# Patient Record
Sex: Female | Born: 1965 | Race: White | Hispanic: No | Marital: Married
Health system: Southern US, Community
[De-identification: ages and names within clinical notes are randomized; demographics above are authoritative.]

---

## 2002-05-27 ENCOUNTER — Encounter: Admission: RE | Admit: 2002-05-27 | Discharge: 2002-05-27 | Payer: Self-pay | Admitting: Sports Medicine

## 2002-06-24 ENCOUNTER — Encounter: Admission: RE | Admit: 2002-06-24 | Discharge: 2002-06-24 | Payer: Self-pay | Admitting: Family Medicine

## 2006-02-21 ENCOUNTER — Encounter: Admission: RE | Admit: 2006-02-21 | Discharge: 2006-02-21 | Payer: Self-pay | Admitting: Family Medicine

## 2013-01-15 ENCOUNTER — Other Ambulatory Visit: Payer: Self-pay | Admitting: Emergency Medicine

## 2013-01-15 DIAGNOSIS — E049 Nontoxic goiter, unspecified: Secondary | ICD-10-CM

## 2013-01-17 ENCOUNTER — Other Ambulatory Visit: Payer: Self-pay

## 2013-01-17 ENCOUNTER — Ambulatory Visit
Admission: RE | Admit: 2013-01-17 | Discharge: 2013-01-17 | Disposition: A | Discharge status: Home / Self Care | Payer: BC Managed Care – PPO | Source: Ambulatory Visit | Attending: Emergency Medicine | Admitting: Emergency Medicine

## 2013-01-17 DIAGNOSIS — E049 Nontoxic goiter, unspecified: Secondary | ICD-10-CM

## 2018-06-03 ENCOUNTER — Other Ambulatory Visit: Payer: Self-pay | Admitting: Family Medicine

## 2018-06-03 ENCOUNTER — Ambulatory Visit
Admission: RE | Admit: 2018-06-03 | Discharge: 2018-06-03 | Disposition: A | Discharge status: Home / Self Care | Payer: BLUE CROSS/BLUE SHIELD | Source: Ambulatory Visit | Attending: Family Medicine | Admitting: Family Medicine

## 2018-06-03 DIAGNOSIS — M25579 Pain in unspecified ankle and joints of unspecified foot: Secondary | ICD-10-CM

## 2018-09-25 ENCOUNTER — Ambulatory Visit: Payer: BLUE CROSS/BLUE SHIELD | Admitting: Physical Therapy

## 2018-10-02 ENCOUNTER — Encounter: Payer: BLUE CROSS/BLUE SHIELD | Admitting: Physical Therapy

## 2018-10-02 ENCOUNTER — Ambulatory Visit (INDEPENDENT_AMBULATORY_CARE_PROVIDER_SITE_OTHER): Payer: BLUE CROSS/BLUE SHIELD | Admitting: Physical Therapy

## 2018-10-02 DIAGNOSIS — M25572 Pain in left ankle and joints of left foot: Secondary | ICD-10-CM | POA: Diagnosis not present

## 2018-10-02 DIAGNOSIS — R2689 Other abnormalities of gait and mobility: Secondary | ICD-10-CM | POA: Diagnosis not present

## 2018-10-02 NOTE — Patient Instructions (Signed)
Access Code: [email or web address]  URL: [email or web address]  Date: 10/02/2018  Prepared by: Sedalia Muta   Exercises  Gastroc Stretch on Wall - 3 reps - 30 hold - 2x daily  Standing Dorsiflexion Self-Mobilization on Step - 10 reps - 5 hold - 2x daily  Long Sitting Ankle Dorsiflexion AROM - 10 reps - 1 sets - 2x daily  Seated Heel Raise - 10 reps - 1 sets - 2x daily

## 2018-10-03 ENCOUNTER — Encounter: Payer: Self-pay | Admitting: Physical Therapy

## 2018-10-03 NOTE — Therapy (Addendum)
[facility] [address] [place], [place], [address] Phone: [phone]   Fax:  [phone]  Physical Therapy Evaluation  Patient Details  Name: Erin Wiggins MRN: [id number] Date of Birth: 22-Sep-1965 Referring Provider (PT): Shelly Flatten   Encounter Date: 10/02/2018  PT End of Session - 10/03/18 2247    Visit Number  1    Number of Visits  12    Date for PT Re-Evaluation  11/12/18    Authorization Type  BCBS    PT Start Time  1425    PT Stop Time  1512    PT Time Calculation (min)  47 min    Activity Tolerance  Patient tolerated treatment well    Behavior During Therapy  [facility] for tasks assessed/performed       History reviewed. No pertinent past medical history.  History reviewed. No pertinent surgical history.  There were no vitals filed for this visit.  Subjective Assessment - 10/03/18 2246    Subjective  Pt states ankle sprain around 2005, thinks pain started then. Did wear boot for about 2 month out of boot since end of Jan. Has increased pain at lateral ankle. Increased pain with       Pt works at [employer], does have standing desk.  Denies numbness/tingling, burning.     Currently in Pain?  Yes    Pain Score  5     Pain Location  Ankle    Pain Orientation  Left    Pain Descriptors / Indicators  Aching    Pain Type  Chronic pain    Pain Onset  More than a month ago    Pain Frequency  Intermittent    Aggravating Factors   standing, walking,     Pain Relieving Factors  rest         [facility] PT Assessment - 10/03/18 0001      Assessment   Medical Diagnosis  L peroneal pain    Referring Provider (PT)  Shelly Flatten    Prior Therapy  no      Precautions   Precautions  None      Restrictions   Weight Bearing Restrictions  No      Balance Screen   Has the patient fallen in the past 6 months  No      Prior Function   Level of Independence  Independent      Cognition   Overall Cognitive Status  Within  Functional Limits for tasks assessed      Posture/Postural Control   Posture Comments  Supination bilaterally, rearfoot varus, wide forefoot.       ROM / Strength   AROM / PROM / Strength  AROM;Strength      AROM   Overall AROM Comments  DF mild/moderate limitation bilaterally;  Mild rearfoot EV limitation.       Strength   Overall Strength Comments  Hip: 4/5, Knee: 4/5;  Ankle: 4-/5       Palpation   Palpation comment  Pain at L peroneal tendon,  Significant Pain at base of L fibula(on talus) and on ATFL;  STJ mild/mod hypomobility;       Special Tests   Other special tests  Minimal pain with resisted Inv/Ev.                    [facility] Adult PT Treatment/Exercise - 10/03/18 0001      Exercises   Exercises  Ankle  Ankle Exercises: Stretches   Gastroc Stretch  2 reps;30 seconds    Gastroc Stretch Limitations  at wall    Other Stretch  DF glide on step x20;       Ankle Exercises: Seated   Heel Raises  20 reps    Heel Raises Limitations  Education for mechanics      Ankle Exercises: Supine   Other Supine Ankle Exercises  AROM PF/DF x20;              PT Education - 10/03/18 2246    Education Details  PT POC, HEP    Person(s) Educated  Patient    Methods  Explanation;Demonstration;Tactile cues    Comprehension  Verbalized understanding;Returned demonstration;Need further instruction       PT Short Term Goals - 10/03/18 2253      PT SHORT TERM GOAL #1   Title  Pt to be independent with initial HEP     Time  2    Period  Weeks    Status  New    Target Date  10/16/18      PT SHORT TERM GOAL #2   Title  Pt to report decreased pain in L ankle, to 3/10 with standing and activity.     Time  2    Period  Weeks    Status  New    Target Date  10/16/18        PT Long Term Goals - 10/03/18 2254      PT LONG TERM GOAL #1   Title  Pt to be independent with final HEP     Time  6    Period  Weeks    Status  New    Target Date  11/13/18       PT LONG TERM GOAL #2   Title  Pt to report decreased pain in L ankle, to 0-2/10 with standing and activity.     Time  6    Period  Weeks    Status  New    Target Date  11/13/18      PT LONG TERM GOAL #3   Title  Pt to demo improved DF ROM in L ankle, to be at least 5 deg, to improve gait and stair ability.     Time  6    Period  Weeks    Status  New    Target Date  11/13/18      PT LONG TERM GOAL #4   Title  Pt to demo ability for ambulation, up to .5 mi, with pain no greater than 2/10, to improve ability for community navigation, and ability for exercise     Time  6    Period  Weeks    Status  New    Target Date  11/13/18            Plan - 10/03/18 2248    Clinical Impression Statement  Pt presents with primary complaint of increased pain in Lateral ankle. Pt with pain in peroneal region, as well as at ATFL. Pt with poor foot alignement, with moderate to significant supination and rearfoot varus. Pt with improved foot alignement with current shoes, but pt will benefit from foot wear recommendation for imroved fit and alignment. Pt with decreased ROM in bil ankles, wtih hypomobilie STJ and decreased DF. Pt with decreased strength and significant decrease in stability of L ankle vs R. Pt with poor ability for sustaining ambulation or exercise due to pain.  Pt to benefit from skilled PT to improve deficits and improve pain.     Clinical Presentation  Stable    Clinical Decision Making  Low    Rehab Potential  Good    PT Frequency  2x / week    PT Duration  6 weeks    PT Treatment/Interventions  ADLs/Self Care Home Management;Cryotherapy;Electrical Stimulation;Iontophoresis 4mg /ml Dexamethasone;Functional mobility training;Stair training;Gait training;DME Instruction;Ultrasound;Moist Heat;Therapeutic activities;Therapeutic exercise;Balance training;Neuromuscular re-education;Patient/family education;Passive range of motion;Orthotic Fit/Training;Manual techniques;Dry  needling;Taping;Joint Manipulations    Consulted and Agree with Plan of Care  Patient       Patient will benefit from skilled therapeutic intervention in order to improve the following deficits and impairments:  Abnormal gait, Decreased range of motion, Difficulty walking, Increased muscle spasms, Decreased endurance, Decreased activity tolerance, Pain, Impaired flexibility, Improper body mechanics, Hypomobility, Decreased balance, Decreased strength, Decreased mobility  Visit Diagnosis: Pain in left ankle and joints of left foot  Other abnormalities of gait and mobility     Problem List There are no active problems to display for this patient.  Erin Wiggins, PT, DPT 10:57 PM  10/03/18    [facility] [address] [address] [place], [place], [address] Phone: [phone]   Fax:  [phone]  Name: Erin Wiggins MRN: [id number] Date of Birth: 02/24/1966

## 2018-10-07 ENCOUNTER — Telehealth: Payer: Self-pay | Admitting: General Practice

## 2018-10-07 NOTE — Telephone Encounter (Signed)
°  Assigned the referral to [facility] to [facility] [place] work - Erin Wiggins Their office will contact pt to schedule direclty [place] therapist [place] Address: [address], [place], [place] [address] Phone: [phone]    Copied from CRM [id number]. Topic: General - Other >> Sep 16, 2018  8:58 AM Erin Wiggins wrote: Reason for CRM: pt calling stating she is waiting on a call from [place] to schedule her PT she was told [facility] more new pt >> Sep 16, 2018 11:43 AM Erin Wiggins, Erin Wiggins wrote: Gavin Pound can you help with this?

## 2018-10-09 ENCOUNTER — Ambulatory Visit (INDEPENDENT_AMBULATORY_CARE_PROVIDER_SITE_OTHER): Payer: BLUE CROSS/BLUE SHIELD | Admitting: Physical Therapy

## 2018-10-09 ENCOUNTER — Encounter: Payer: Self-pay | Admitting: Physical Therapy

## 2018-10-09 DIAGNOSIS — R2689 Other abnormalities of gait and mobility: Secondary | ICD-10-CM | POA: Diagnosis not present

## 2018-10-09 DIAGNOSIS — M25572 Pain in left ankle and joints of left foot: Secondary | ICD-10-CM

## 2018-10-09 NOTE — Therapy (Addendum)
[facility] [address] [place], [place], [address] Phone: [phone]   Fax:  [phone]  Physical Therapy Treatment  Patient Details  Name: Erin Wiggins MRN: [id number] Date of Birth: [birth year] Referring Provider (PT): Shelly Flatten   Encounter Date: 10/09/2018  PT End of Session - 10/09/18 1523    Visit Number  2    Number of Visits  12    Date for PT Re-Evaluation  11/12/18    Authorization Type  BCBS    PT Start Time  1434    PT Stop Time  1523    PT Time Calculation (min)  49 min    Activity Tolerance  Patient tolerated treatment well    Behavior During Therapy  [facility] for tasks assessed/performed       History reviewed. No pertinent past medical history.  History reviewed. No pertinent surgical history.  There were no vitals filed for this visit.  Subjective Assessment - 10/09/18 1523    Subjective  Pt states mild decrease in pain.     Currently in Pain?  Yes    Pain Score  3     Pain Location  Ankle    Pain Orientation  Left    Pain Descriptors / Indicators  Aching    Pain Type  Chronic pain    Pain Onset  More than a month ago    Pain Frequency  Intermittent                       [facility] Adult PT Treatment/Exercise - 10/09/18 1433      Posture/Postural Control   Posture Comments  --      Exercises   Exercises  Ankle      Modalities   Modalities  Iontophoresis      Iontophoresis   Type of Iontophoresis  Dexamethasone    Location  L lateral ankle    Time  6 hr patch      Manual Therapy   Manual Therapy  Joint mobilization;Passive ROM    Joint Mobilization  To improve L ankle DF.     Passive ROM  Manual DF stretching      Ankle Exercises: Stretches   Gastroc Stretch  2 reps;30 seconds    Gastroc Stretch Limitations  at wall    Other Stretch  DF glide kneeling at wall x2 min      Ankle Exercises: Aerobic   Stationary Bike  L 1 x 8 min;       Ankle Exercises: Standing   Other  Standing Ankle Exercises  Weight shifts/staggered stance (pre-gait) x20 bil;       Ankle Exercises: Seated   Heel Raises  20 reps    Heel Raises Limitations  with ball at ankles for alignment      Ankle Exercises: Supine   Other Supine Ankle Exercises  --               PT Short Term Goals - 10/03/18 2253      PT SHORT TERM GOAL #1   Title  Pt to be independent with initial HEP     Time  2    Period  Weeks    Status  New    Target Date  10/16/18      PT SHORT TERM GOAL #2   Title  Pt to report decreased pain in L ankle, to 3/10 with standing and activity.     Time  2    Period  Weeks    Status  New    Target Date  10/16/18        PT Long Term Goals - 10/03/18 2254      PT LONG TERM GOAL #1   Title  Pt to be independent with final HEP     Time  6    Period  Weeks    Status  New    Target Date  11/13/18      PT LONG TERM GOAL #2   Title  Pt to report decreased pain in L ankle, to 0-2/10 with standing and activity.     Time  6    Period  Weeks    Status  New    Target Date  11/13/18      PT LONG TERM GOAL #3   Title  Pt to demo improved DF ROM in L ankle, to be at least 5 deg, to improve gait and stair ability.     Time  6    Period  Weeks    Status  New    Target Date  11/13/18      PT LONG TERM GOAL #4   Title  Pt to demo ability for ambulation, up to .5 mi, with pain no greater than 2/10, to improve ability for community navigation, and ability for exercise     Time  6    Period  Weeks    Status  New    Target Date  11/13/18            Plan - 10/09/18 1537    Clinical Impression Statement  Pt with improving pain in lateral anke, is sore with deep palpation. Ther ex for improving Gastroc and DF done today, as well as joint mobs. Ionto done for pain at lateral ankle. Shoe recommendation given for improved alignment of foot and ankle.     Rehab Potential  Good    PT Frequency  2x / week    PT Duration  6 weeks    PT Treatment/Interventions   ADLs/Self Care Home Management;Cryotherapy;Electrical Stimulation;Iontophoresis 4mg /ml Dexamethasone;Functional mobility training;Stair training;Gait training;DME Instruction;Ultrasound;Moist Heat;Therapeutic activities;Therapeutic exercise;Balance training;Neuromuscular re-education;Patient/family education;Passive range of motion;Orthotic Fit/Training;Manual techniques;Dry needling;Taping;Joint Manipulations    Consulted and Agree with Plan of Care  Patient       Patient will benefit from skilled therapeutic intervention in order to improve the following deficits and impairments:  Abnormal gait, Decreased range of motion, Difficulty walking, Increased muscle spasms, Decreased endurance, Decreased activity tolerance, Pain, Impaired flexibility, Improper body mechanics, Hypomobility, Decreased balance, Decreased strength, Decreased mobility  Visit Diagnosis: Pain in left ankle and joints of left foot  Other abnormalities of gait and mobility     Problem List There are no active problems to display for this patient.    Sedalia Muta, PT, DPT 3:41 PM  10/09/18   [place] [address] [place], [place], [address] Phone: [phone]   Fax:  [phone]  Name: Erin Wiggins MRN: [id number] Date of Birth: Feb 11, 1966

## 2018-10-14 ENCOUNTER — Ambulatory Visit (INDEPENDENT_AMBULATORY_CARE_PROVIDER_SITE_OTHER): Payer: BLUE CROSS/BLUE SHIELD | Admitting: Physical Therapy

## 2018-10-14 ENCOUNTER — Encounter: Payer: Self-pay | Admitting: Physical Therapy

## 2018-10-14 DIAGNOSIS — M25572 Pain in left ankle and joints of left foot: Secondary | ICD-10-CM | POA: Diagnosis not present

## 2018-10-14 DIAGNOSIS — R2689 Other abnormalities of gait and mobility: Secondary | ICD-10-CM

## 2018-10-15 NOTE — Therapy (Signed)
[facility] [address] [place], [place], [address] Phone: [phone]   Fax:  [phone]  Physical Therapy Treatment  Patient Details  Name: Erin Wiggins MRN: [id number] Date of Birth: January 22, 1966 Referring Provider (PT): Shelly Flatten   Encounter Date: 10/14/2018  PT End of Session - 10/14/18 1440    Visit Number  3    Number of Visits  12    Date for PT Re-Evaluation  11/12/18    Authorization Type  BCBS    PT Start Time  1435    PT Stop Time  1515    PT Time Calculation (min)  40 min    Activity Tolerance  Patient tolerated treatment well    Behavior During Therapy  [facility] for tasks assessed/performed       History reviewed. No pertinent past medical history.  History reviewed. No pertinent surgical history.  There were no vitals filed for this visit.  Subjective Assessment - 10/14/18 1440    Subjective  Pt states soreness today.     Currently in Pain?  Yes    Pain Score  5     Pain Location  Ankle    Pain Orientation  Left    Pain Descriptors / Indicators  Aching    Pain Type  Chronic pain    Pain Onset  More than a month ago    Pain Frequency  Intermittent                       [facility] Adult PT Treatment/Exercise - 10/14/18 1443      Exercises   Exercises  Ankle      Modalities   Modalities  Iontophoresis      Iontophoresis   Type of Iontophoresis  Dexamethasone    Location  L lateral ankle    Time  6 hr patch      Manual Therapy   Manual Therapy  Joint mobilization;Passive ROM;Soft tissue mobilization    Joint Mobilization  To improve L ankle DF.     Soft tissue mobilization  STM/IASTM to lateral gastroc, ant tib, peroneals and lateral ankle.     Passive ROM  Manual DF stretching      Ankle Exercises: Stretches   Gastroc Stretch  2 reps;30 seconds    Gastroc Stretch Limitations  on step    Other Stretch  DF glide foot on chair x20       Ankle Exercises: Aerobic   Stationary Bike  L 1  x 8 min;       Ankle Exercises: Standing   Other Standing Ankle Exercises  --      Ankle Exercises: Seated   Heel Raises  20 reps    Heel Raises Limitations  with ball at ankles for alignment      Ankle Exercises: Supine   Other Supine Ankle Exercises  INV with RTB x20 ; EV isometrics x15;                 PT Short Term Goals - 10/03/18 2253      PT SHORT TERM GOAL #1   Title  Pt to be independent with initial HEP     Time  2    Period  Weeks    Status  New    Target Date  10/16/18      PT SHORT TERM GOAL #2   Title  Pt to report decreased pain in L ankle, to 3/10 with  standing and activity.     Time  2    Period  Weeks    Status  New    Target Date  10/16/18        PT Long Term Goals - 10/03/18 2254      PT LONG TERM GOAL #1   Title  Pt to be independent with final HEP     Time  6    Period  Weeks    Status  New    Target Date  11/13/18      PT LONG TERM GOAL #2   Title  Pt to report decreased pain in L ankle, to 0-2/10 with standing and activity.     Time  6    Period  Weeks    Status  New    Target Date  11/13/18      PT LONG TERM GOAL #3   Title  Pt to demo improved DF ROM in L ankle, to be at least 5 deg, to improve gait and stair ability.     Time  6    Period  Weeks    Status  New    Target Date  11/13/18      PT LONG TERM GOAL #4   Title  Pt to demo ability for ambulation, up to .5 mi, with pain no greater than 2/10, to improve ability for community navigation, and ability for exercise     Time  6    Period  Weeks    Status  New    Target Date  11/13/18            Plan - 10/15/18 1229    Clinical Impression Statement  Pt with soreness with attempts for EV and strengthening today. DTM/IASTM done for lateral leg musculature, pt with soreness and tightenss. Ionto continued for pain, may benefit from k-tape for decompression as well.     Rehab Potential  Good    PT Frequency  2x / week    PT Duration  6 weeks    PT  Treatment/Interventions  ADLs/Self Care Home Management;Cryotherapy;Electrical Stimulation;Iontophoresis 4mg /ml Dexamethasone;Functional mobility training;Stair training;Gait training;DME Instruction;Ultrasound;Moist Heat;Therapeutic activities;Therapeutic exercise;Balance training;Neuromuscular re-education;Patient/family education;Passive range of motion;Orthotic Fit/Training;Manual techniques;Dry needling;Taping;Joint Manipulations    Consulted and Agree with Plan of Care  Patient       Patient will benefit from skilled therapeutic intervention in order to improve the following deficits and impairments:  Abnormal gait, Decreased range of motion, Difficulty walking, Increased muscle spasms, Decreased endurance, Decreased activity tolerance, Pain, Impaired flexibility, Improper body mechanics, Hypomobility, Decreased balance, Decreased strength, Decreased mobility  Visit Diagnosis: Pain in left ankle and joints of left foot  Other abnormalities of gait and mobility     Problem List There are no active problems to display for this patient.   Sedalia Muta, PT, DPT 12:33 PM  10/15/18    [facility] [facility] [address] [place], [place], [address] Phone: [phone]   Fax:  [phone]  Name: CRYSTELLE LANGS MRN: [id number] Date of Birth: 1966-04-17

## 2018-10-16 ENCOUNTER — Ambulatory Visit (INDEPENDENT_AMBULATORY_CARE_PROVIDER_SITE_OTHER): Payer: BLUE CROSS/BLUE SHIELD | Admitting: Physical Therapy

## 2018-10-16 ENCOUNTER — Other Ambulatory Visit: Payer: Self-pay

## 2018-10-16 DIAGNOSIS — R2689 Other abnormalities of gait and mobility: Secondary | ICD-10-CM | POA: Diagnosis not present

## 2018-10-16 DIAGNOSIS — M25572 Pain in left ankle and joints of left foot: Secondary | ICD-10-CM | POA: Diagnosis not present

## 2018-10-16 NOTE — Therapy (Addendum)
[place] [address] [place], [place], [address] Phone: [phone]   Fax:  [phone]  Physical Therapy Treatment  Patient Details  Name: Erin Wiggins MRN: [id number] Date of Birth: 1965-08-28 Referring Provider (PT): Linna Darner   Encounter Date: 10/16/2018  PT End of Session - 10/16/18 1443    Visit Number  4    Number of Visits  12    Date for PT Re-Evaluation  11/12/18    Authorization Type  [employer]    PT Start Time  1430    PT Stop Time  1514    PT Time Calculation (min)  44 min    Activity Tolerance  Patient tolerated treatment well    Behavior During Therapy  [facility] for tasks assessed/performed       No past medical history on file.  No past surgical history on file.  There were no vitals filed for this visit.  Subjective Assessment - 10/16/18 1442    Subjective  Pt states soreness after last visit, better today.     Currently in Pain?  Yes    Pain Score  2     Pain Location  Ankle    Pain Orientation  Left    Pain Descriptors / Indicators  Aching    Pain Type  Chronic pain    Pain Onset  More than a month ago    Pain Frequency  Intermittent                       [facility] Adult PT Treatment/Exercise - 10/16/18 1443      Exercises   Exercises  Ankle      Modalities   Modalities  Iontophoresis      Iontophoresis   Type of Iontophoresis  Dexamethasone    Location  L lateral ankle    Time  6 hr patch      Manual Therapy   Manual Therapy  Joint mobilization;Passive ROM;Soft tissue mobilization    Joint Mobilization  Post glide, To improve L ankle DF, Ant glide, Medial glide, distraction.      Soft tissue mobilization  --    Passive ROM  Manual DF stretching      Ankle Exercises: Stretches   Gastroc Stretch  2 reps;30 seconds    Gastroc Stretch Limitations  on step    Other Stretch  DF glide foot on chair x20       Ankle Exercises: Aerobic   Stationary Bike  L 1 x 8 min;       Ankle  Exercises: Seated   Heel Raises  --    Heel Raises Limitations  --      Ankle Exercises: Supine   Other Supine Ankle Exercises  INV/EV with RTB x20       Ankle Exercises: Standing   Heel Raises  20 reps    Heel Raises Limitations  With ball at ankles for alignement    Other Standing Ankle Exercises  Weight shifts L/R and A/P on AirEx x20 each;  Staggered stance weight shifts (pre-gait) onto L foot x20;      Other Standing Ankle Exercises  Tandem stance 30 sec x2 bil;                PT Short Term Goals - 10/03/18 2253      PT SHORT TERM GOAL #1   Title  Pt to be independent with initial HEP     Time  2    Period  Weeks    Status  New    Target Date  10/16/18      PT SHORT TERM GOAL #2   Title  Pt to report decreased pain in L ankle, to 3/10 with standing and activity.     Time  2    Period  Weeks    Status  New    Target Date  10/16/18        PT Long Term Goals - 10/03/18 2254      PT LONG TERM GOAL #1   Title  Pt to be independent with final HEP     Time  6    Period  Weeks    Status  New    Target Date  11/13/18      PT LONG TERM GOAL #2   Title  Pt to report decreased pain in L ankle, to 0-2/10 with standing and activity.     Time  6    Period  Weeks    Status  New    Target Date  11/13/18      PT LONG TERM GOAL #3   Title  Pt to demo improved DF ROM in L ankle, to be at least 5 deg, to improve gait and stair ability.     Time  6    Period  Weeks    Status  New    Target Date  11/13/18      PT LONG TERM GOAL #4   Title  Pt to demo ability for ambulation, up to .5 mi, with pain no greater than 2/10, to improve ability for community navigation, and ability for exercise     Time  6    Period  Weeks    Status  New    Target Date  11/13/18            Plan - 10/16/18 1528    Clinical Impression Statement  Pt with decreased pain with palpation to lateral ankle today from previous visit. Pt with difficulty with all balance activities. Educated  on foot posture with weight bearing and weight shifts. Pt given footwear recommendation, would benefit from neutral shoe to assist with reducing supination postition of foot. Plan to progress as tolerated.     Rehab Potential  Good    PT Frequency  2x / week    PT Duration  6 weeks    PT Treatment/Interventions  ADLs/Self Care Home Management;Cryotherapy;Electrical Stimulation;Iontophoresis '4mg'$ /ml Dexamethasone;Functional mobility training;Stair training;Gait training;DME Instruction;Ultrasound;Moist Heat;Therapeutic activities;Therapeutic exercise;Balance training;Neuromuscular re-education;Patient/family education;Passive range of motion;Orthotic Fit/Training;Manual techniques;Dry needling;Taping;Joint Manipulations    Consulted and Agree with Plan of Care  Patient       Patient will benefit from skilled therapeutic intervention in order to improve the following deficits and impairments:  Abnormal gait, Decreased range of motion, Difficulty walking, Increased muscle spasms, Decreased endurance, Decreased activity tolerance, Pain, Impaired flexibility, Improper body mechanics, Hypomobility, Decreased balance, Decreased strength, Decreased mobility  Visit Diagnosis: Pain in left ankle and joints of left foot  Other abnormalities of gait and mobility     Problem List There are no active problems to display for this patient.  Lyndee Hensen, PT, DPT 3:29 PM  10/16/18    [place], [place], [address] Phone: [phone]   Fax:  [phone]  Name: IFE VITELLI MRN: [id number] Date of Birth: Nov 28, 1965     PHYSICAL THERAPY DISCHARGE SUMMARY  Visits from  Start of Care: 4  Plan: Patient agrees to discharge.  Patient goals were partially met. Patient is being discharged due to not returning since the last visit.  ?????     Pt last seen 10/16/18. Clinic with reduced hours due to COVID, and pt wished to stay home  during this time. Pt offered appointment at beginning of June, wishes to hold off for now. Will d/c.    Lyndee Hensen, PT, DPT 11:00 AM  01/20/19

## 2018-10-21 ENCOUNTER — Encounter: Payer: BLUE CROSS/BLUE SHIELD | Admitting: Physical Therapy

## 2018-10-23 ENCOUNTER — Encounter: Payer: BLUE CROSS/BLUE SHIELD | Admitting: Physical Therapy

## 2018-10-24 NOTE — Addendum Note (Signed)
Addended by: Sedalia Muta on: 10/24/2018 10:44 AM   Modules accepted: Orders

## 2018-10-28 ENCOUNTER — Encounter: Payer: BLUE CROSS/BLUE SHIELD | Admitting: Physical Therapy

## 2018-10-30 ENCOUNTER — Encounter: Payer: BLUE CROSS/BLUE SHIELD | Admitting: Physical Therapy

## 2018-11-04 ENCOUNTER — Encounter: Payer: BLUE CROSS/BLUE SHIELD | Admitting: Physical Therapy

## 2018-11-06 ENCOUNTER — Encounter: Payer: BLUE CROSS/BLUE SHIELD | Admitting: Physical Therapy

## 2018-11-25 ENCOUNTER — Encounter: Payer: BLUE CROSS/BLUE SHIELD | Admitting: Physical Therapy

## 2018-12-23 ENCOUNTER — Telehealth: Payer: Self-pay | Admitting: Physical Therapy

## 2018-12-23 NOTE — Telephone Encounter (Signed)
Called to discuss return to PT, and pt would like to continue to hold sessions due to COVID-19 until the end of August.  Advised we would need a new referral to resume services.  Pt agreeable and understanding.  Clarita Crane, PT, DPT 12/23/18 2:38 PM   [facility] [address] [place], [place], [address] Phone: [phone]  Fax: [phone]

## 2020-01-25 IMAGING — CR DG ANKLE COMPLETE 3+V*L*
3 series · 3 of 3 positions shown · non-contrast
Comparison: None.

CLINICAL DATA: 52-year-old female with left ankle pain for 3 years.
Remote inversion injury. Initial encounter.

EXAM:
LEFT ANKLE COMPLETE - 3+ VIEW

[x ankle ap left]
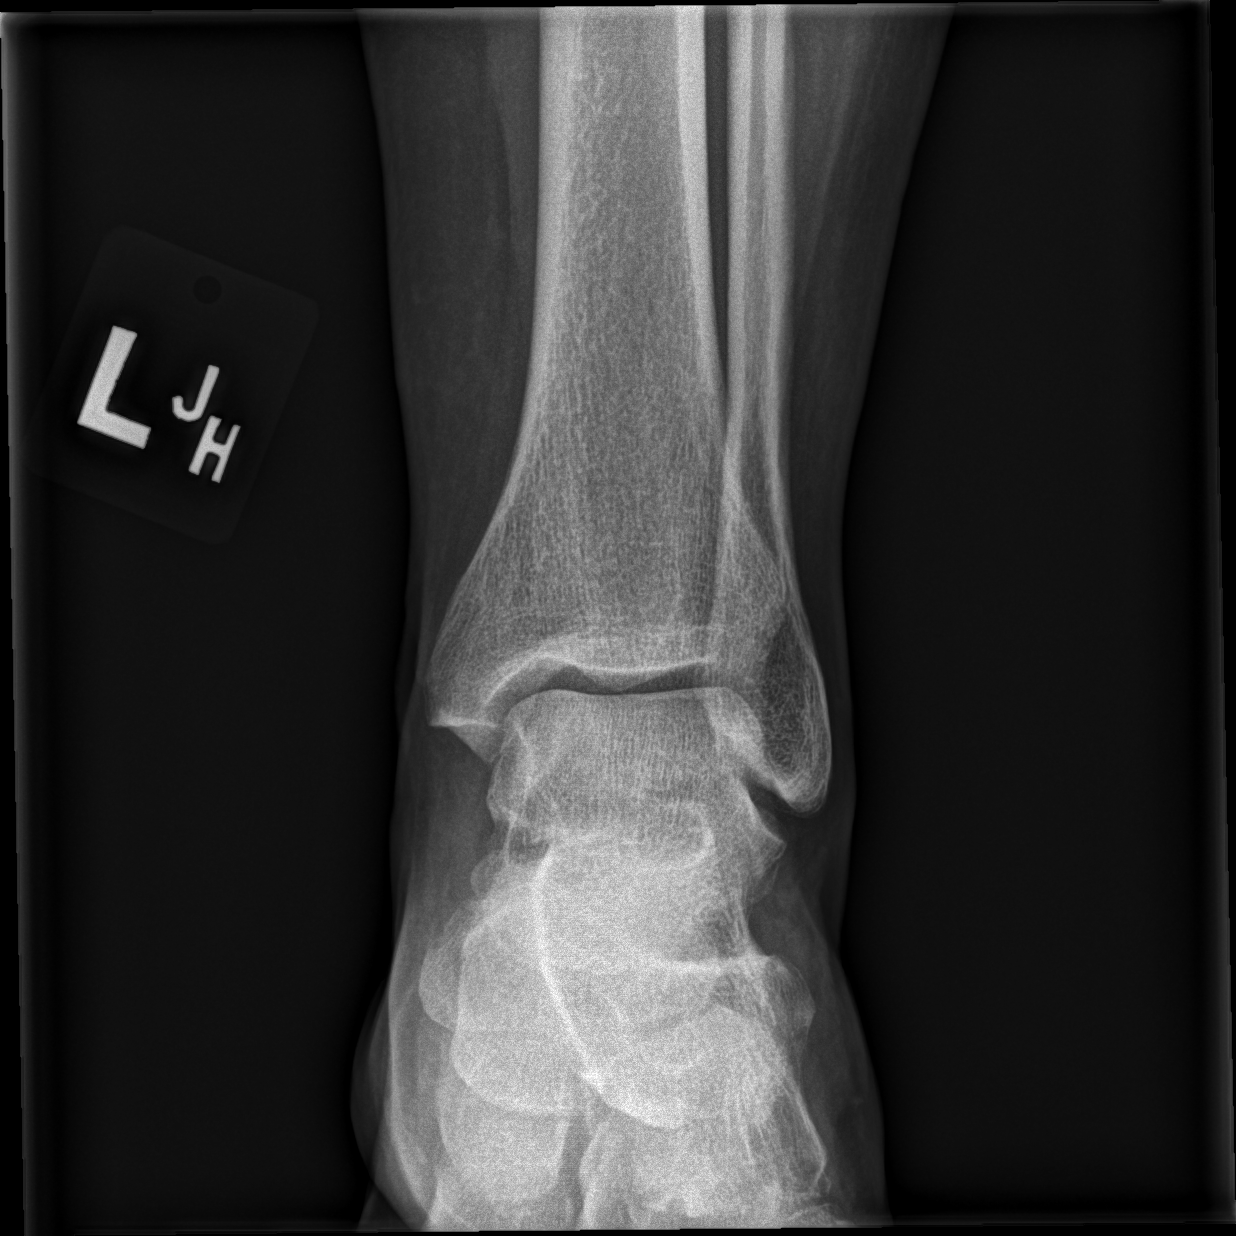

[x ankle obl left]
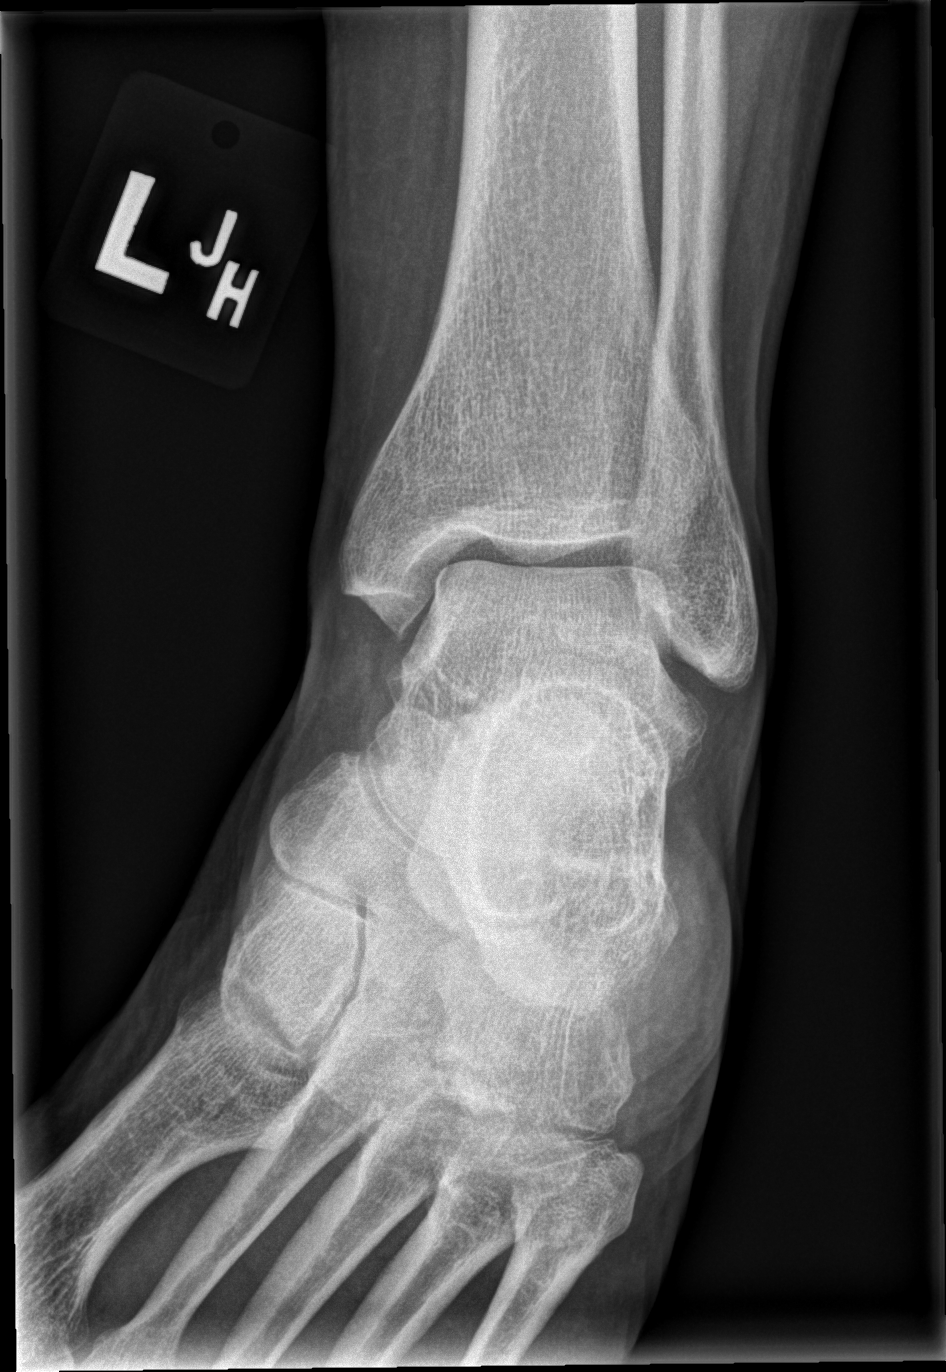

[x ankle lat left]
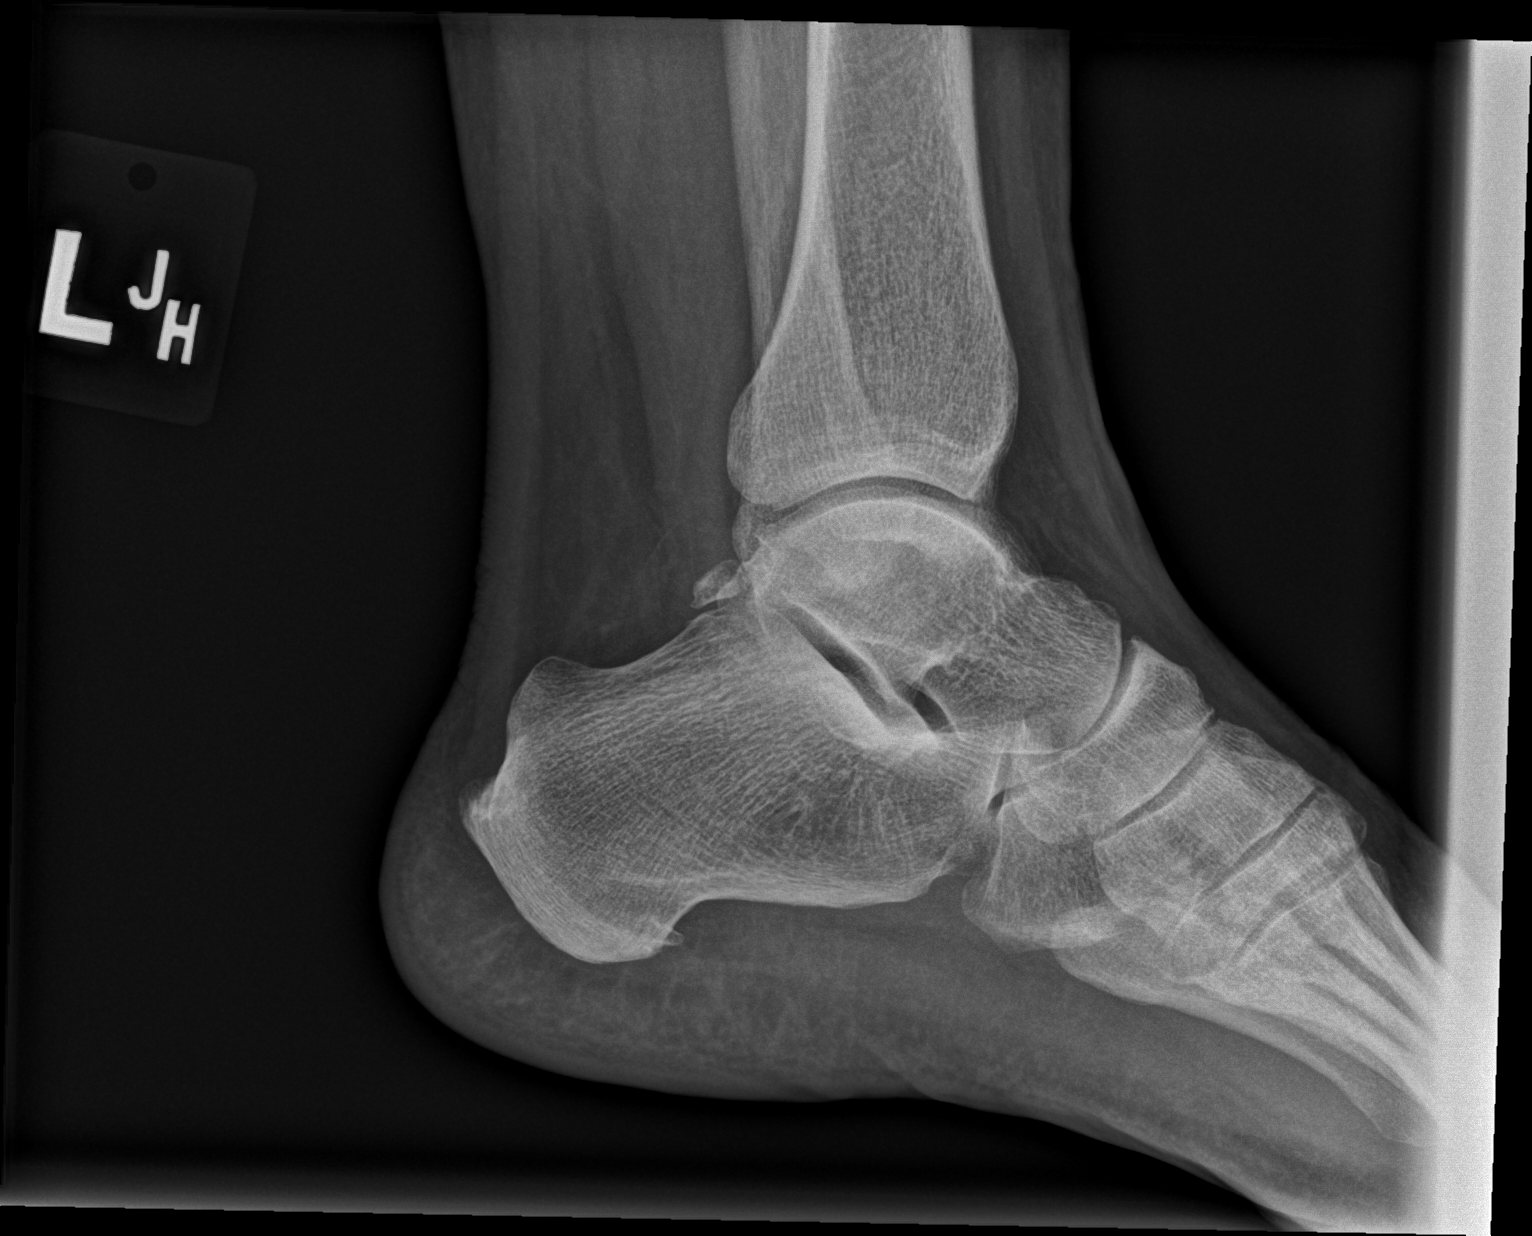

[3 of 3 positions shown; findings below may reference images not displayed]

FINDINGS: No fracture or dislocation. Ankle mortise is intact. No abnormal
soft tissue swelling. Small plantar spur. Possible os trigone.
IMPRESSION: 1.  No fracture or dislocation.
2. Small plantar spur
3. Possible os trigone.

## 2022-06-12 ENCOUNTER — Other Ambulatory Visit: Payer: Self-pay | Admitting: Family Medicine

## 2022-06-12 DIAGNOSIS — G8929 Other chronic pain: Secondary | ICD-10-CM

## 2022-06-12 NOTE — Progress Notes (Signed)
Acute on chronic knee pain. Recent flare from increased ambulation and navigating steep stairs.

## 2022-06-13 ENCOUNTER — Ambulatory Visit
Admission: RE | Admit: 2022-06-13 | Discharge: 2022-06-13 | Disposition: A | Discharge status: Home / Self Care | Payer: 59 | Source: Ambulatory Visit | Attending: Family Medicine | Admitting: Family Medicine

## 2022-06-13 DIAGNOSIS — G8929 Other chronic pain: Secondary | ICD-10-CM
